# Patient Record
Sex: Male | Born: 1980 | Race: White | Hispanic: No | Marital: Married | Smoking: Never smoker
Health system: Southern US, Community
[De-identification: ages and names within clinical notes are randomized; demographics above are authoritative.]

## PROBLEM LIST (undated history)

## (undated) HISTORY — PX: ANTERIOR CRUCIATE LIGAMENT REPAIR: SHX115

---

## 2019-09-27 ENCOUNTER — Other Ambulatory Visit: Payer: Self-pay

## 2019-09-27 ENCOUNTER — Encounter (HOSPITAL_BASED_OUTPATIENT_CLINIC_OR_DEPARTMENT_OTHER): Payer: Self-pay | Admitting: Emergency Medicine

## 2019-09-27 ENCOUNTER — Emergency Department (HOSPITAL_BASED_OUTPATIENT_CLINIC_OR_DEPARTMENT_OTHER): Payer: BLUE CROSS/BLUE SHIELD

## 2019-09-27 ENCOUNTER — Emergency Department (HOSPITAL_BASED_OUTPATIENT_CLINIC_OR_DEPARTMENT_OTHER)
Admission: EM | Admit: 2019-09-27 | Discharge: 2019-09-27 | Disposition: A | Discharge status: Home / Self Care | Payer: BLUE CROSS/BLUE SHIELD | Attending: Emergency Medicine | Admitting: Emergency Medicine

## 2019-09-27 DIAGNOSIS — U071 COVID-19: Secondary | ICD-10-CM | POA: Diagnosis not present

## 2019-09-27 DIAGNOSIS — R509 Fever, unspecified: Secondary | ICD-10-CM | POA: Diagnosis present

## 2019-09-27 DIAGNOSIS — J1282 Pneumonia due to coronavirus disease 2019: Secondary | ICD-10-CM | POA: Insufficient documentation

## 2019-09-27 MED ORDER — DOXYCYCLINE HYCLATE 100 MG PO TABS
100.0000 mg | ORAL_TABLET | Freq: Once | ORAL | Status: AC
  Administered 2019-09-27: 100 mg via ORAL
  Filled 2019-09-27: qty 1

## 2019-09-27 MED ORDER — KETOROLAC TROMETHAMINE 30 MG/ML IJ SOLN
30.0000 mg | Freq: Once | INTRAMUSCULAR | Status: AC
  Administered 2019-09-27: 30 mg via INTRAMUSCULAR
  Filled 2019-09-27: qty 1

## 2019-09-27 MED ORDER — IBUPROFEN 600 MG PO TABS
600.0000 mg | ORAL_TABLET | Freq: Four times a day (QID) | ORAL | 0 refills | Status: AC | PRN
Start: 2019-09-27 — End: ?

## 2019-09-27 MED ORDER — DOXYCYCLINE HYCLATE 100 MG PO CAPS
100.0000 mg | ORAL_CAPSULE | Freq: Two times a day (BID) | ORAL | 0 refills | Status: AC
Start: 2019-09-27 — End: ?

## 2019-09-27 MED ORDER — DEXAMETHASONE SODIUM PHOSPHATE 10 MG/ML IJ SOLN
10.0000 mg | Freq: Once | INTRAMUSCULAR | Status: AC
  Administered 2019-09-27: 10 mg via INTRAMUSCULAR
  Filled 2019-09-27: qty 1

## 2019-09-27 MED ORDER — HYDROCOD POLST-CPM POLST ER 10-8 MG/5ML PO SUER: 5.0000 mL | Freq: Two times a day (BID) | ORAL | 0 refills | Status: AC | PRN

## 2019-09-27 NOTE — ED Provider Notes (Signed)
[facility] EMERGENCY DEPARTMENT Provider Note   CSN: [id number] Arrival date & time: 09/27/19  0408     History Chief Complaint  Patient presents with  . covid positive    Mitchell Silva is a 39 y.o. male.  Pt presents to the ED today with a continued fever and cough after being diagnosed with Covid.  Pt developed sx on 8/1 after a trip to [place].  He had a negative rapid Covid test on 8/2 and a positive PCR Covid test on 8/4.  He took tylenol this am round 0300 for a fever of 102.6.  He had a severe coughing spell this morning.  He said his O2 sats dropped to the 70s with the cough.  He is feeling better now.  He has not been vaccinated.        History reviewed. No pertinent past medical history.  There are no problems to display for this patient.   Past Surgical History:  Procedure Laterality Date  . ANTERIOR CRUCIATE LIGAMENT REPAIR         History reviewed. No pertinent family history.  Social History   Tobacco Use  . Smoking status: Never Smoker  . Smokeless tobacco: Never Used  Vaping Use  . Vaping Use: Never used  Substance Use Topics  . Alcohol use: Yes    Comment: occ  . Drug use: Never    Home Medications Prior to Admission medications   Medication Sig Start Date End Date Taking? Authorizing Provider  chlorpheniramine-HYDROcodone (TUSSIONEX PENNKINETIC ER) 10-8 MG/5ML SUER Take 5 mLs by mouth every 12 (twelve) hours as needed for cough. 09/27/19   Jacalyn Lefevre, MD  doxycycline (VIBRAMYCIN) 100 MG capsule Take 1 capsule (100 mg total) by mouth 2 (two) times daily. 09/27/19   Jacalyn Lefevre, MD  ibuprofen (ADVIL) 600 MG tablet Take 1 tablet (600 mg total) by mouth every 6 (six) hours as needed. 09/27/19   Jacalyn Lefevre, MD    Allergies    Patient has no known allergies.  Review of Systems   Review of Systems  Constitutional: Positive for fatigue and fever.  Respiratory: Positive for cough and shortness of breath.   All  other systems reviewed and are negative.   Physical Exam Updated Vital Signs BP 126/64 (BP Location: Right Arm)   Pulse 65   Temp 99.3 F (37.4 C) (Oral)   Resp 18   Ht 5\' 9"  (1.753 m)   Wt 86.2 kg   SpO2 98%   BMI 28.06 kg/m   Physical Exam Vitals and nursing note reviewed.  Constitutional:      Appearance: Normal appearance.  HENT:     Head: Normocephalic and atraumatic.     Right Ear: External ear normal.     Left Ear: External ear normal.     Nose: Nose normal.     Mouth/Throat:     Mouth: Mucous membranes are moist.     Pharynx: Oropharynx is clear.  Eyes:     Extraocular Movements: Extraocular movements intact.     Conjunctiva/sclera: Conjunctivae normal.     Pupils: Pupils are equal, round, and reactive to light.  Cardiovascular:     Rate and Rhythm: Normal rate.     Pulses: Normal pulses.     Heart sounds: Normal heart sounds.  Pulmonary:     Effort: Pulmonary effort is normal.     Breath sounds: Rhonchi present.  Abdominal:     General: Abdomen is flat. Bowel sounds are normal.  Palpations: Abdomen is soft.  Musculoskeletal:        General: Normal range of motion.     Cervical back: Normal range of motion.  Skin:    General: Skin is warm.     Capillary Refill: Capillary refill takes less than 2 seconds.  Neurological:     General: No focal deficit present.     Mental Status: He is alert and oriented to person, place, and time.  Psychiatric:        Mood and Affect: Mood normal.        Behavior: Behavior normal.        Thought Content: Thought content normal.        Judgment: Judgment normal.     ED Results / Procedures / Treatments   Labs (all labs ordered are listed, but only abnormal results are displayed) Labs Reviewed - No data to display  EKG None  Radiology DG Chest Portable 1 View  Result Date: 09/27/2019 CLINICAL DATA:  Cough, history of COVID EXAM: PORTABLE CHEST 1 VIEW COMPARISON:  None. FINDINGS: The cardiomediastinal  silhouette is normal in contour. No pleural effusion. No pneumothorax. There is LEFT peripheral predominant heterogeneous opacities and RIGHT perihilar predominant heterogeneous opacities. Visualized abdomen is unremarkable. No acute osseous abnormality noted. IMPRESSION: Bilateral heterogeneous opacities consistent with COVID-19 pneumonia. Electronically Signed   By: Meda Klinefelter MD   On: 09/27/2019 07:51    Procedures Procedures (including critical care time)  Medications Ordered in ED Medications  dexamethasone (DECADRON) injection 10 mg (has no administration in time range)  ketorolac (TORADOL) 30 MG/ML injection 30 mg (has no administration in time range)  doxycycline (VIBRA-TABS) tablet 100 mg (has no administration in time range)    ED Course  I have reviewed the triage vital signs and the nursing notes.  Pertinent labs & imaging results that were available during my care of the patient were reviewed by me and considered in my medical decision making (see chart for details).    MDM Rules/Calculators/A&P                          Pt does have bilateral pneumonia on CXR.  He is able to ambulate with a pulse ox in the upper 90s.  He does not appear to be sob with ambulation.  HR stays normal also.  He can go home with return precautions.  He is given decadron, toradol, and doxy in the ED and will be d/c home with doxy and tussionex.  Pt is instructed to return if worse.   [name] was evaluated in Emergency Department on 09/27/2019 for the symptoms described in the history of present illness. He was evaluated in the context of the global COVID-19 pandemic, which necessitated consideration that the patient might be at risk for infection with the SARS-CoV-2 virus that causes COVID-19. Institutional protocols and algorithms that pertain to the evaluation of patients at risk for COVID-19 are in a state of rapid change based on information released by regulatory bodies  including the CDC and federal and state organizations. These policies and algorithms were followed during the patient's care in the ED. Final Clinical Impression(s) / ED Diagnoses Final diagnoses:  Pneumonia due to COVID-19 virus    Rx / DC Orders ED Discharge Orders         Ordered    doxycycline (VIBRAMYCIN) 100 MG capsule  2 times daily     Discontinue  Reprint  09/27/19 0802    chlorpheniramine-HYDROcodone (TUSSIONEX PENNKINETIC ER) 10-8 MG/5ML SUER  Every 12 hours PRN     Discontinue  Reprint     09/27/19 0803    ibuprofen (ADVIL) 600 MG tablet  Every 6 hours PRN     Discontinue  Reprint     09/27/19 0803           Jacalyn Lefevre, MD 09/27/19 [phone]

## 2019-09-27 NOTE — ED Notes (Signed)
Ambulated in room, SpO2 96-100%, HR 70s, no complaint of SHOB.  EDP @ bedside

## 2019-09-27 NOTE — ED Triage Notes (Signed)
Pt states he is covid positive  Tested positive 10 days ago  Pt states he has had a fever for all 10 days  Pt states he has a cough  This morning he had a coughing attack and his oxygen levels dropped  Pt just wants to be checked out  Pt states he took tylenol around 3 am for fever of 102.6

## 2021-12-07 IMAGING — DX DG CHEST 1V PORT
1 series · 1 of 1 positions shown · non-contrast
Comparison: None.

CLINICAL DATA: Cough, history of COVID

EXAM:
PORTABLE CHEST 1 VIEW

[chest ap]
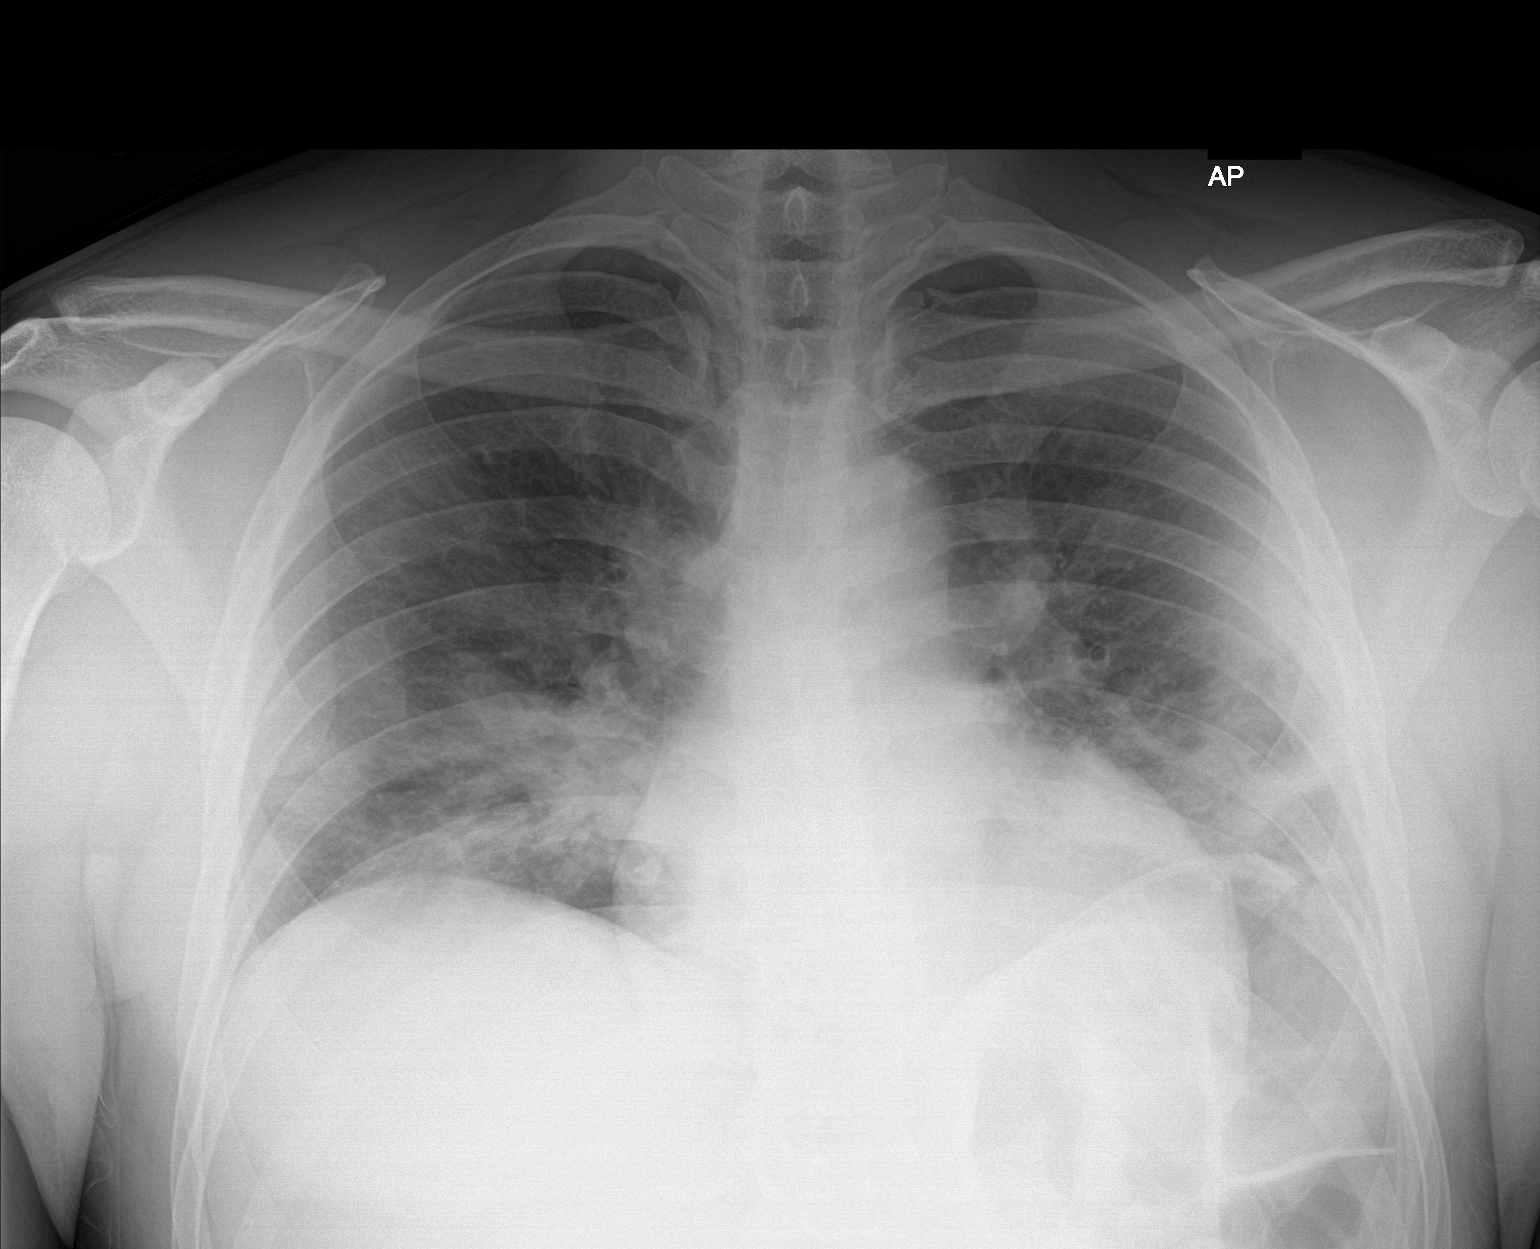

[1 of 1 positions shown; findings below may reference images not displayed]

FINDINGS: The cardiomediastinal silhouette is normal in contour. No pleural
effusion. No pneumothorax. There is LEFT peripheral predominant
heterogeneous opacities and RIGHT perihilar predominant
heterogeneous opacities. Visualized abdomen is unremarkable. No
acute osseous abnormality noted.
IMPRESSION: Bilateral heterogeneous opacities consistent with [id number]
pneumonia.
